# Patient Record
Sex: Male | Born: 2000 | Race: White | Hispanic: No | Marital: Single | Smoking: Current every day smoker
Health system: Southern US, Community
[De-identification: ages and names within clinical notes are randomized; demographics above are authoritative.]

---
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
## 2018-01-18 ENCOUNTER — Emergency Department (HOSPITAL_COMMUNITY)
Admission: EM | Admit: 2018-01-18 | Discharge: 2018-01-18 | Disposition: A | Discharge status: Home / Self Care | Payer: Self-pay | Attending: Emergency Medicine | Admitting: Emergency Medicine

## 2018-01-18 ENCOUNTER — Other Ambulatory Visit: Payer: Self-pay

## 2018-01-18 ENCOUNTER — Encounter (HOSPITAL_COMMUNITY): Payer: Self-pay | Admitting: Emergency Medicine

## 2018-01-18 DIAGNOSIS — F17210 Nicotine dependence, cigarettes, uncomplicated: Secondary | ICD-10-CM | POA: Insufficient documentation

## 2018-01-18 DIAGNOSIS — R1084 Generalized abdominal pain: Secondary | ICD-10-CM | POA: Insufficient documentation

## 2018-01-18 LAB — CBC
HCT: 47.2 % (ref 39.0–52.0)
Hemoglobin: 15.6 g/dL (ref 13.0–17.0)
MCH: 30.2 pg (ref 26.0–34.0)
MCHC: 33.1 g/dL (ref 30.0–36.0)
MCV: 91.3 fL (ref 80.0–100.0)
Platelets: 279 10*3/uL (ref 150–400)
RBC: 5.17 MIL/uL (ref 4.22–5.81)
RDW: 12 % (ref 11.5–15.5)
WBC: 7.8 10*3/uL (ref 4.0–10.5)
nRBC: 0 % (ref 0.0–0.2)

## 2018-01-18 LAB — COMPREHENSIVE METABOLIC PANEL
ALT: 28 U/L (ref 0–44)
AST: 23 U/L (ref 15–41)
Albumin: 4.7 g/dL (ref 3.5–5.0)
Alkaline Phosphatase: 51 U/L (ref 38–126)
Anion gap: 10 (ref 5–15)
BUN: 11 mg/dL (ref 6–20)
CO2: 25 mmol/L (ref 22–32)
Calcium: 9.3 mg/dL (ref 8.9–10.3)
Chloride: 104 mmol/L (ref 98–111)
Creatinine, Ser: 1.1 mg/dL (ref 0.61–1.24)
GFR calc Af Amer: >60 mL/min (ref >60)
GFR calc non Af Amer: >60 mL/min (ref >60)
Glucose, Bld: 105 mg/dL — ABNORMAL HIGH (ref 70–99)
Potassium: 4.4 mmol/L (ref 3.5–5.1)
Sodium: 139 mmol/L (ref 135–145)
Total Bilirubin: 0.5 mg/dL (ref 0.3–1.2)
Total Protein: 8 g/dL (ref 6.5–8.1)

## 2018-01-18 LAB — LIPASE, BLOOD: Lipase: 24 U/L (ref 11–51)

## 2018-01-18 MED ORDER — DICYCLOMINE HCL 20 MG PO TABS: 20.0000 mg | ORAL_TABLET | Freq: Two times a day (BID) | ORAL | 0 refills | Status: AC

## 2018-01-18 NOTE — Discharge Instructions (Signed)
As discussed, your evaluation today has been largely reassuring.  But, it is important that you monitor your condition carefully, and do not hesitate to return to the ED if you develop new, or concerning changes in your condition. ? ?Otherwise, please follow-up with your physician for appropriate ongoing care. ? ?

## 2018-01-18 NOTE — ED Provider Notes (Signed)
[facility]-EMERGENCY DEPT Provider Note   CSN: [id number] Arrival date & time: 01/18/18  1530     History   Chief Complaint Chief Complaint  Patient presents with  . Abdominal Pain    HPI Casey Cross is a 17 y.o. male.  HPI Previously well young male presents with abdominal pain, which is essentially resolved. Patient was in his usual state of health until this morning, when after awakening he felt some pain around his mid abdomen. Pain is nonradiating, sore, moderate There is at least one episode of loose stool subsequently, but no vomiting. Patient notes that he is not anorexic. No fever, no chest pain, dyspnea or other complaints per Patient states that he is generally well, has no medical problems, has not had no surgery in his abdomen Currently the abdominal pain is minimal, in the same location.  History reviewed. No pertinent past medical history.  There are no active problems to display for this patient.   History reviewed. No pertinent surgical history.      Home Medications    Prior to Admission medications   Medication Sig Start Date End Date Taking? Authorizing Provider  dicyclomine (BENTYL) 20 MG tablet Take 1 tablet (20 mg total) by mouth 2 (two) times daily for 5 days. 01/18/18 01/23/18  Gerhard MunchLockwood, Fonnie Crookshanks, MD    Family History No family history on file.  Social History Social History   Tobacco Use  . Smoking status: Current Every Day Smoker    Types: Cigarettes  . Smokeless tobacco: Never Used  Substance Use Topics  . Alcohol use: Not on file  . Drug use: Not on file     Allergies   Patient has no known allergies.   Review of Systems Review of Systems  Constitutional:       Per HPI, otherwise negative  HENT:       Per HPI, otherwise negative  Respiratory:       Per HPI, otherwise negative  Cardiovascular:       Per HPI, otherwise negative  Gastrointestinal: Positive for abdominal pain and diarrhea.  Negative for vomiting.  Endocrine:       Negative aside from HPI  Genitourinary:       Neg aside from HPI   Musculoskeletal:       Per HPI, otherwise negative  Skin: Negative.   Neurological: Negative for syncope.     Physical Exam Updated Vital Signs Ht 5' 6.5" (1.689 m)   Physical Exam  Constitutional: He is oriented to person, place, and time. He appears well-developed. No distress.  HENT:  Head: Normocephalic and atraumatic.  Eyes: Conjunctivae and EOM are normal.  Cardiovascular: Normal rate and regular rhythm.  Pulmonary/Chest: Effort normal. No stridor. No respiratory distress.  Abdominal: He exhibits no distension.  No guarding, no rebound, no appreciable pain anywhere, though the patient notes that the midabdomen is work earlier today.  Musculoskeletal: He exhibits no edema.  Neurological: He is alert and oriented to person, place, and time.  Skin: Skin is warm and dry.  Psychiatric: He has a normal mood and affect.  Nursing note and vitals reviewed.    ED Treatments / Results  Labs (all labs ordered are listed, but only abnormal results are displayed) Labs Reviewed  COMPREHENSIVE METABOLIC PANEL - Abnormal; Notable for the following components:      Result Value   Glucose, Bld 105 (*)    All other components within normal limits  LIPASE, BLOOD  CBC  URINALYSIS, ROUTINE W REFLEX MICROSCOPIC   Procedures Procedures (including critical care time)  Medications Ordered in ED Medications - No data to display   Initial Impression / Assessment and Plan / ED Course  I have reviewed the triage vital signs and the nursing notes.  Pertinent labs & imaging results that were available during my care of the patient were reviewed by me and considered in my medical decision making (see chart for details).  Young male presents after an episode of abdominal pain which is essentially resolved. Patient is awake, alert, afebrile, no leukocytosis Patient has no  tenderness on exam, and there is low suspicion for occult appendicitis given his improvement in pain, no nausea, no anorexia. Patient amenable to course of outpatient therapy with pain control, understands return precautions. Absent ongoing pain, hemodynamic instability, abnormal labs found minimal elevated glucose, the patient is appropriate for discharge with close outpatient follow-up.   Final Clinical Impressions(s) / ED Diagnoses   Final diagnoses:  Generalized abdominal pain    ED Discharge Orders         Ordered    dicyclomine (BENTYL) 20 MG tablet  2 times daily     01/18/18 1856           Gerhard Munch, MD 01/18/18 1900

## 2018-01-18 NOTE — ED Triage Notes (Signed)
Per [facility] pt from home for lower abd pains that started when woke up around 6-7 am. Did have some diarrhea last night. Had 3 episodes of vomiting today. 134 palpated. CBG 97

## 2018-01-19 ENCOUNTER — Emergency Department (HOSPITAL_COMMUNITY): Payer: Self-pay | Admitting: Certified Registered Nurse Anesthetist

## 2018-01-19 ENCOUNTER — Other Ambulatory Visit: Payer: Self-pay

## 2018-01-19 ENCOUNTER — Observation Stay (HOSPITAL_COMMUNITY)
Admission: EM | Admit: 2018-01-19 | Discharge: 2018-01-20 | Disposition: A | Discharge status: Home / Self Care | Payer: Self-pay | Attending: Surgery | Admitting: Surgery

## 2018-01-19 ENCOUNTER — Emergency Department (HOSPITAL_COMMUNITY): Payer: Self-pay

## 2018-01-19 ENCOUNTER — Encounter (HOSPITAL_COMMUNITY): Payer: Self-pay | Admitting: Emergency Medicine

## 2018-01-19 ENCOUNTER — Encounter (HOSPITAL_COMMUNITY)
Admission: EM | Disposition: A | Discharge status: Home / Self Care | Payer: Self-pay | Source: Home / Self Care | Attending: Emergency Medicine

## 2018-01-19 DIAGNOSIS — K3589 Other acute appendicitis without perforation or gangrene: Principal | ICD-10-CM | POA: Insufficient documentation

## 2018-01-19 DIAGNOSIS — F17200 Nicotine dependence, unspecified, uncomplicated: Secondary | ICD-10-CM | POA: Insufficient documentation

## 2018-01-19 DIAGNOSIS — R590 Localized enlarged lymph nodes: Secondary | ICD-10-CM | POA: Insufficient documentation

## 2018-01-19 DIAGNOSIS — Z79899 Other long term (current) drug therapy: Secondary | ICD-10-CM | POA: Insufficient documentation

## 2018-01-19 DIAGNOSIS — K3580 Unspecified acute appendicitis: Secondary | ICD-10-CM | POA: Diagnosis present

## 2018-01-19 HISTORY — PX: LAPAROSCOPIC APPENDECTOMY: SHX408

## 2018-01-19 LAB — CBC WITH DIFFERENTIAL/PLATELET
Abs Immature Granulocytes: 0.03 10*3/uL (ref 0.00–0.07)
Basophils Absolute: 0 10*3/uL (ref 0.0–0.1)
Basophils Relative: 0 %
Eosinophils Absolute: 0.1 10*3/uL (ref 0.0–0.5)
Eosinophils Relative: 1 %
HEMATOCRIT: 45.4 % (ref 39.0–52.0)
Hemoglobin: 15.3 g/dL (ref 13.0–17.0)
Immature Granulocytes: 0 %
Lymphocytes Relative: 19 %
Lymphs Abs: 1.7 10*3/uL (ref 0.7–4.0)
MCH: 31.2 pg (ref 26.0–34.0)
MCHC: 33.7 g/dL (ref 30.0–36.0)
MCV: 92.5 fL (ref 80.0–100.0)
Monocytes Absolute: 0.7 10*3/uL (ref 0.1–1.0)
Monocytes Relative: 8 %
NEUTROS PCT: 72 %
Neutro Abs: 6.6 10*3/uL (ref 1.7–7.7)
Platelets: 247 10*3/uL (ref 150–400)
RBC: 4.91 MIL/uL (ref 4.22–5.81)
RDW: 12.1 % (ref 11.5–15.5)
WBC: 9.2 10*3/uL (ref 4.0–10.5)
nRBC: 0 % (ref 0.0–0.2)

## 2018-01-19 LAB — COMPREHENSIVE METABOLIC PANEL
ALT: 24 U/L (ref 0–44)
AST: 19 U/L (ref 15–41)
Albumin: 4.6 g/dL (ref 3.5–5.0)
Alkaline Phosphatase: 47 U/L (ref 38–126)
Anion gap: 9 (ref 5–15)
BILIRUBIN TOTAL: 0.5 mg/dL (ref 0.3–1.2)
BUN: 11 mg/dL (ref 6–20)
CO2: 25 mmol/L (ref 22–32)
Calcium: 9.4 mg/dL (ref 8.9–10.3)
Chloride: 105 mmol/L (ref 98–111)
Creatinine, Ser: 1.01 mg/dL (ref 0.61–1.24)
GFR calc Af Amer: >60 mL/min (ref >60)
Glucose, Bld: 83 mg/dL (ref 70–99)
Potassium: 3.7 mmol/L (ref 3.5–5.1)
Sodium: 139 mmol/L (ref 135–145)
TOTAL PROTEIN: 8 g/dL (ref 6.5–8.1)

## 2018-01-19 LAB — LIPASE, BLOOD: Lipase: 25 U/L (ref 11–51)

## 2018-01-19 SURGERY — APPENDECTOMY, LAPAROSCOPIC
Anesthesia: General

## 2018-01-19 MED ORDER — LACTATED RINGERS IR SOLN
Status: DC | PRN
  Administered 2018-01-19: 1

## 2018-01-19 MED ORDER — PROPOFOL 10 MG/ML IV BOLUS
INTRAVENOUS | Status: AC
  Filled 2018-01-19: qty 40

## 2018-01-19 MED ORDER — LIDOCAINE 2% (20 MG/ML) 5 ML SYRINGE
INTRAMUSCULAR | Status: DC | PRN
  Administered 2018-01-19: 80 mg via INTRAVENOUS

## 2018-01-19 MED ORDER — SODIUM CHLORIDE (PF) 0.9 % IJ SOLN
INTRAMUSCULAR | Status: AC
  Filled 2018-01-19: qty 50

## 2018-01-19 MED ORDER — SODIUM CHLORIDE 0.9 % IV SOLN
2.0000 g | Freq: Once | INTRAVENOUS | Status: AC
  Administered 2018-01-19: 2 g via INTRAVENOUS
  Filled 2018-01-19: qty 20

## 2018-01-19 MED ORDER — FENTANYL CITRATE (PF) 100 MCG/2ML IJ SOLN
INTRAMUSCULAR | Status: DC | PRN
  Administered 2018-01-19: 100 ug via INTRAVENOUS
  Administered 2018-01-19: 50 ug via INTRAVENOUS

## 2018-01-19 MED ORDER — PROPOFOL 10 MG/ML IV BOLUS
INTRAVENOUS | Status: DC | PRN
  Administered 2018-01-19: 200 mg via INTRAVENOUS

## 2018-01-19 MED ORDER — ROCURONIUM BROMIDE 10 MG/ML (PF) SYRINGE
PREFILLED_SYRINGE | INTRAVENOUS | Status: DC | PRN
  Administered 2018-01-19: 40 mg via INTRAVENOUS

## 2018-01-19 MED ORDER — FENTANYL CITRATE (PF) 100 MCG/2ML IJ SOLN
INTRAMUSCULAR | Status: AC
  Filled 2018-01-19: qty 2

## 2018-01-19 MED ORDER — MIDAZOLAM HCL 2 MG/2ML IJ SOLN
INTRAMUSCULAR | Status: AC
  Filled 2018-01-19: qty 2

## 2018-01-19 MED ORDER — ROCURONIUM BROMIDE 100 MG/10ML IV SOLN
INTRAVENOUS | Status: AC
  Filled 2018-01-19: qty 1

## 2018-01-19 MED ORDER — SUCCINYLCHOLINE CHLORIDE 200 MG/10ML IV SOSY
PREFILLED_SYRINGE | INTRAVENOUS | Status: AC
  Filled 2018-01-19: qty 10

## 2018-01-19 MED ORDER — HYDROMORPHONE HCL 1 MG/ML IJ SOLN
INTRAMUSCULAR | Status: DC | PRN
  Administered 2018-01-19 – 2018-01-20 (×2): 1 mg via INTRAVENOUS

## 2018-01-19 MED ORDER — IOPAMIDOL (ISOVUE-300) INJECTION 61%
INTRAVENOUS | Status: AC
  Filled 2018-01-19: qty 100

## 2018-01-19 MED ORDER — LIDOCAINE 2% (20 MG/ML) 5 ML SYRINGE
INTRAMUSCULAR | Status: AC
  Filled 2018-01-19: qty 5

## 2018-01-19 MED ORDER — ONDANSETRON HCL 4 MG/2ML IJ SOLN
INTRAMUSCULAR | Status: AC
  Filled 2018-01-19: qty 2

## 2018-01-19 MED ORDER — LACTATED RINGERS IV SOLN
INTRAVENOUS | Status: DC | PRN
  Administered 2018-01-19 (×2): via INTRAVENOUS

## 2018-01-19 MED ORDER — SUCCINYLCHOLINE CHLORIDE 200 MG/10ML IV SOSY
PREFILLED_SYRINGE | INTRAVENOUS | Status: DC | PRN
  Administered 2018-01-19: 60 mg via INTRAVENOUS

## 2018-01-19 MED ORDER — METRONIDAZOLE IN NACL 5-0.79 MG/ML-% IV SOLN
INTRAVENOUS | Status: AC
  Filled 2018-01-19: qty 100

## 2018-01-19 MED ORDER — BUPIVACAINE-EPINEPHRINE (PF) 0.25% -1:200000 IJ SOLN
INTRAMUSCULAR | Status: AC
  Filled 2018-01-19: qty 30

## 2018-01-19 MED ORDER — 0.9 % SODIUM CHLORIDE (POUR BTL) OPTIME
TOPICAL | Status: DC | PRN
  Administered 2018-01-19: 1000 mL

## 2018-01-19 MED ORDER — HYDROMORPHONE HCL 2 MG/ML IJ SOLN
INTRAMUSCULAR | Status: AC
  Filled 2018-01-19: qty 1

## 2018-01-19 MED ORDER — DEXAMETHASONE SODIUM PHOSPHATE 10 MG/ML IJ SOLN
INTRAMUSCULAR | Status: AC
  Filled 2018-01-19: qty 1

## 2018-01-19 MED ORDER — MORPHINE SULFATE (PF) 4 MG/ML IV SOLN
4.0000 mg | Freq: Once | INTRAVENOUS | Status: AC
  Administered 2018-01-19: 4 mg via INTRAVENOUS
  Filled 2018-01-19: qty 1

## 2018-01-19 MED ORDER — ONDANSETRON HCL 4 MG/2ML IJ SOLN
INTRAMUSCULAR | Status: DC | PRN
  Administered 2018-01-19: 4 mg via INTRAVENOUS

## 2018-01-19 MED ORDER — ONDANSETRON HCL 4 MG/2ML IJ SOLN
4.0000 mg | Freq: Once | INTRAMUSCULAR | Status: AC
  Administered 2018-01-19: 4 mg via INTRAVENOUS
  Filled 2018-01-19: qty 2

## 2018-01-19 MED ORDER — SUGAMMADEX SODIUM 200 MG/2ML IV SOLN
INTRAVENOUS | Status: AC
  Filled 2018-01-19: qty 2

## 2018-01-19 MED ORDER — IOPAMIDOL (ISOVUE-300) INJECTION 61%
100.0000 mL | Freq: Once | INTRAVENOUS | Status: AC | PRN
  Administered 2018-01-19: 100 mL via INTRAVENOUS

## 2018-01-19 MED ORDER — DEXAMETHASONE SODIUM PHOSPHATE 4 MG/ML IJ SOLN
INTRAMUSCULAR | Status: DC | PRN
  Administered 2018-01-19: 10 mg via INTRAVENOUS

## 2018-01-19 MED ORDER — MIDAZOLAM HCL 5 MG/5ML IJ SOLN
INTRAMUSCULAR | Status: DC | PRN
  Administered 2018-01-19: 2 mg via INTRAVENOUS

## 2018-01-19 MED ORDER — SODIUM CHLORIDE 0.9 % IV SOLN
INTRAVENOUS | Status: AC
  Filled 2018-01-19: qty 20

## 2018-01-19 MED ORDER — METRONIDAZOLE IN NACL 5-0.79 MG/ML-% IV SOLN
500.0000 mg | Freq: Once | INTRAVENOUS | Status: AC
  Administered 2018-01-19: 500 mg via INTRAVENOUS
  Filled 2018-01-19: qty 100

## 2018-01-19 SURGICAL SUPPLY — 38 items
APPLIER CLIP ROT 10 11.4 M/L (STAPLE)
BENZOIN TINCTURE PRP APPL 2/3 (GAUZE/BANDAGES/DRESSINGS) IMPLANT
CABLE HIGH FREQUENCY MONO STRZ (ELECTRODE) IMPLANT
CHLORAPREP W/TINT 26ML (MISCELLANEOUS) ×3 IMPLANT
CLIP APPLIE ROT 10 11.4 M/L (STAPLE) IMPLANT
CLOSURE WOUND 1/2 X4 (GAUZE/BANDAGES/DRESSINGS)
COVER SURGICAL LIGHT HANDLE (MISCELLANEOUS) ×3 IMPLANT
COVER WAND RF STERILE (DRAPES) IMPLANT
CUTTER FLEX LINEAR 45M (STAPLE) ×3 IMPLANT
DECANTER SPIKE VIAL GLASS SM (MISCELLANEOUS) IMPLANT
DERMABOND ADVANCED (GAUZE/BANDAGES/DRESSINGS) ×2
DERMABOND ADVANCED .7 DNX12 (GAUZE/BANDAGES/DRESSINGS) ×1 IMPLANT
DRAPE LAPAROSCOPIC ABDOMINAL (DRAPES) IMPLANT
ELECT PENCIL ROCKER SW 15FT (MISCELLANEOUS) ×3 IMPLANT
ELECT REM PT RETURN 15FT ADLT (MISCELLANEOUS) ×3 IMPLANT
ENDOLOOP SUT PDS II  0 18 (SUTURE)
ENDOLOOP SUT PDS II 0 18 (SUTURE) IMPLANT
GLOVE SURG SIGNA 7.5 PF LTX (GLOVE) ×9 IMPLANT
GOWN STRL REUS W/TWL XL LVL3 (GOWN DISPOSABLE) ×9 IMPLANT
KIT BASIN OR (CUSTOM PROCEDURE TRAY) ×3 IMPLANT
POUCH RETRIEVAL ECOSAC 10 (ENDOMECHANICALS) ×1 IMPLANT
POUCH RETRIEVAL ECOSAC 10MM (ENDOMECHANICALS) ×2
POUCH SPECIMEN RETRIEVAL 10MM (ENDOMECHANICALS) IMPLANT
RELOAD 45 VASCULAR/THIN (ENDOMECHANICALS) IMPLANT
RELOAD STAPLE TA45 3.5 REG BLU (ENDOMECHANICALS) ×3 IMPLANT
SCISSORS LAP 5X35 DISP (ENDOMECHANICALS) ×3 IMPLANT
SET IRRIG TUBING LAPAROSCOPIC (IRRIGATION / IRRIGATOR) ×3 IMPLANT
SHEARS HARMONIC ACE PLUS 36CM (ENDOMECHANICALS) ×3 IMPLANT
SLEEVE XCEL OPT CAN 5 100 (ENDOMECHANICALS) ×3 IMPLANT
STRIP CLOSURE SKIN 1/2X4 (GAUZE/BANDAGES/DRESSINGS) IMPLANT
SUT MNCRL AB 4-0 PS2 18 (SUTURE) ×3 IMPLANT
SUT VIC AB 2-0 SH 18 (SUTURE) IMPLANT
TOWEL OR 17X26 10 PK STRL BLUE (TOWEL DISPOSABLE) ×3 IMPLANT
TOWEL OR NON WOVEN STRL DISP B (DISPOSABLE) IMPLANT
TRAY LAPAROSCOPIC (CUSTOM PROCEDURE TRAY) ×3 IMPLANT
TROCAR BLADELESS OPT 5 100 (ENDOMECHANICALS) ×3 IMPLANT
TROCAR XCEL BLUNT TIP 100MML (ENDOMECHANICALS) ×3 IMPLANT
WIRE ELECTR L HOOK LAP OPTI2 (MISCELLANEOUS) IMPLANT

## 2018-01-19 NOTE — ED Provider Notes (Signed)
[facility]-EMERGENCY DEPT Provider Note   CSN: [id number] Arrival date & time: 01/19/18  1919     History   Chief Complaint No chief complaint on file.   HPI Casey Cross is a 17 y.o. male.  Patient presents the emergency department with a chief complaint of right lower abdominal pain.  States that he was seen yesterday for similar symptoms.  He had had some nausea and vomiting and diarrhea earlier in the week, but states this has pretty much resolved.  States that after his evaluation yesterday, he felt much better, and was able to go home, rest, and eat.  States that the pain worsened today.  He reports that it is localizing to the right lower abdomen.  Denies any reported fevers at home.  Symptoms are worsened with movement and palpation.  The history is provided by the patient. No language interpreter was used.    History reviewed. No pertinent past medical history.  There are no active problems to display for this patient.   History reviewed. No pertinent surgical history.      Home Medications    Prior to Admission medications   Medication Sig Start Date End Date Taking? Authorizing Provider  dicyclomine (BENTYL) 20 MG tablet Take 1 tablet (20 mg total) by mouth 2 (two) times daily for 5 days. 01/18/18 01/23/18  Gerhard Munch, MD    Family History No family history on file.  Social History Social History   Tobacco Use  . Smoking status: Current Every Day Smoker    Packs/day: 0.50    Types: Cigarettes  . Smokeless tobacco: Never Used  Substance Use Topics  . Alcohol use: Never    Frequency: Never  . Drug use: Never     Allergies   Patient has no known allergies.   Review of Systems Review of Systems  All other systems reviewed and are negative.    Physical Exam Updated Vital Signs BP (!) 140/92   Pulse (!) 102   Temp (!) 97.5 F (36.4 C) (Oral)   Resp 18   SpO2 98%   Physical Exam  Constitutional: He is  oriented to person, place, and time. He appears well-developed and well-nourished.  HENT:  Head: Normocephalic and atraumatic.  Eyes: Pupils are equal, round, and reactive to light. Conjunctivae and EOM are normal. Right eye exhibits no discharge. Left eye exhibits no discharge. No scleral icterus.  Neck: Normal range of motion. Neck supple. No JVD present.  Cardiovascular: Normal rate, regular rhythm and normal heart sounds. Exam reveals no gallop and no friction rub.  No murmur heard. Pulmonary/Chest: Effort normal and breath sounds normal. No respiratory distress. He has no wheezes. He has no rales. He exhibits no tenderness.  Abdominal: Soft. He exhibits no distension and no mass. There is tenderness. There is no rebound and no guarding.  Tenderness palpation right lower quadrant  Musculoskeletal: Normal range of motion. He exhibits no edema or tenderness.  Neurological: He is alert and oriented to person, place, and time.  Skin: Skin is warm and dry.  Psychiatric: He has a normal mood and affect. His behavior is normal. Judgment and thought content normal.  Nursing note and vitals reviewed.    ED Treatments / Results  Labs (all labs ordered are listed, but only abnormal results are displayed) Labs Reviewed  CBC WITH DIFFERENTIAL/PLATELET  COMPREHENSIVE METABOLIC PANEL  LIPASE, BLOOD    EKG None  Radiology Ct Abdomen Pelvis W Contrast  Result Date: 01/19/2018  CLINICAL DATA:  Right lower quadrant abdominal pain since yesterday. The pain was more generalized yesterday. EXAM: CT ABDOMEN AND PELVIS WITH CONTRAST TECHNIQUE: Multidetector CT imaging of the abdomen and pelvis was performed using the standard protocol following bolus administration of intravenous contrast. CONTRAST:  100mL ISOVUE-300 IOPAMIDOL (ISOVUE-300) INJECTION 61% COMPARISON:  None. FINDINGS: Lower chest: Clear lung bases. Hepatobiliary: No focal liver abnormality is seen. No gallstones, gallbladder wall  thickening, or biliary dilatation. Pancreas: Unremarkable. No pancreatic ductal dilatation or surrounding inflammatory changes. Spleen: Normal in size without focal abnormality. Adrenals/Urinary Tract: Adrenal glands are unremarkable. Kidneys are normal, without renal calculi, focal lesion, or hydronephrosis. Bladder is unremarkable. Stomach/Bowel: Dilated appendix with diffuse wall thickening and enhancement. The appendiceal margins are ill-defined with periappendiceal soft tissue stranding. No fluid collections are seen. The origin of the appendix is in the upper right pelvis and the tip is also in the upper right pelvis. The appendix measures 10 mm in maximum diameter. Unremarkable stomach, small bowel and colon. Vascular/Lymphatic: Multiple mildly enlarged right lower quadrant mesenteric lymph nodes. The largest has a short axis diameter of 8 mm on image number 53 series 2. No vascular abnormalities. Reproductive: Prostate is unremarkable. Other: No free peritoneal air. Musculoskeletal: Mild thoracolumbar scoliosis. Mild lower thoracic spine Schmorl's node formation. IMPRESSION: 1. Acute appendicitis without abscess. 2. Mild reactive right lower quadrant mesenteric adenopathy. These results were called by telephone at the time of interpretation on 01/19/2018 at 9:47 pm to Dr. Jacqulyn BathLong, who verbally acknowledged these results. Electronically Signed   By: Beckie SaltsSteven  Reid M.D.   On: 01/19/2018 21:50    Procedures Procedures (including critical care time)  Medications Ordered in ED Medications  morphine 4 MG/ML injection 4 mg (has no administration in time range)  ondansetron (ZOFRAN) injection 4 mg (has no administration in time range)     Initial Impression / Assessment and Plan / ED Course  I have reviewed the triage vital signs and the nursing notes.  Pertinent labs & imaging results that were available during my care of the patient were reviewed by me and considered in my medical decision making (see  chart for details).     Patient with right lower abdominal pain.  Seen yesterday, and advised to follow-up if symptoms worsen.  Patient states that he felt well last night, but the pain returned and worsened today.  Vital signs are stable.  He does have tenderness on exam.  Will check CT today.  We will also check labs and treat pain.  Will reassess.  CT consistent with acute appendicitis.  I discussed the case with Dr. Ezzard StandingNewman from general surgery who will see the patient.  Final Clinical Impressions(s) / ED Diagnoses   Final diagnoses:  Other acute appendicitis    ED Discharge Orders    None       Roxy HorsemanBrowning, Damilola Flamm, PA-C 01/19/18 2202    Maia PlanLong, Joshua G, MD 01/20/18 1558

## 2018-01-19 NOTE — ED Notes (Signed)
Patient ambulated to CT

## 2018-01-19 NOTE — ED Triage Notes (Signed)
Pt from home with c/o RLQ pain that he was seen for yesterday. Pt states he was seen for same yestrday but pain was more generalized then. Pt denies emesis today. Pt's lbm was today and normal

## 2018-01-19 NOTE — ED Notes (Addendum)
MD at bedside. Obtained consent for surgery and consent form will be sent to OR with patient. Rocephin and Flagyl is also being sent to OR with patient as this RN did not administer.

## 2018-01-19 NOTE — Anesthesia Preprocedure Evaluation (Signed)
Anesthesia Evaluation  Patient identified by MRN, date of birth, ID band Patient awake    Reviewed: Allergy & Precautions, NPO status , Patient's Chart, lab work & pertinent test results  Airway Mallampati: II  TM Distance: >3 FB Neck ROM: Full    Dental no notable dental hx.    Pulmonary Current Smoker,    Pulmonary exam normal breath sounds clear to auscultation       Cardiovascular negative cardio ROS Normal cardiovascular exam Rhythm:Regular Rate:Normal     Neuro/Psych negative neurological ROS  negative psych ROS   GI/Hepatic negative GI ROS, Neg liver ROS,   Endo/Other  negative endocrine ROS  Renal/GU negative Renal ROS     Musculoskeletal negative musculoskeletal ROS (+)   Abdominal   Peds  Hematology negative hematology ROS (+)   Anesthesia Other Findings acute appendicitis  Reproductive/Obstetrics                             Anesthesia Physical Anesthesia Plan  ASA: II  Anesthesia Plan: General   Post-op Pain Management:    Induction: Intravenous  PONV Risk Score and Plan: 2 and Ondansetron, Dexamethasone, Midazolam and Treatment may vary due to age or medical condition  Airway Management Planned: Oral ETT  Additional Equipment:   Intra-op Plan:   Post-operative Plan: Extubation in OR  Informed Consent: I have reviewed the patients History and Physical, chart, labs and discussed the procedure including the risks, benefits and alternatives for the proposed anesthesia with the patient or authorized representative who has indicated his/her understanding and acceptance.   Dental advisory given  Plan Discussed with: CRNA  Anesthesia Plan Comments:         Anesthesia Quick Evaluation

## 2018-01-19 NOTE — Anesthesia Procedure Notes (Signed)
Procedure Name: Intubation Date/Time: 01/19/2018 11:13 PM Performed by: Vanessa Durhamochran, Kylor Valverde Glenn, CRNA Pre-anesthesia Checklist: Patient identified, Emergency Drugs available, Suction available and Patient being monitored Patient Re-evaluated:Patient Re-evaluated prior to induction Oxygen Delivery Method: Circle system utilized Preoxygenation: Pre-oxygenation with 100% oxygen Induction Type: IV induction Ventilation: Mask ventilation without difficulty Laryngoscope Size: 2 and Miller Grade View: Grade I Tube type: Oral Tube size: 7.5 mm Number of attempts: 1 Airway Equipment and Method: Stylet Placement Confirmation: ETT inserted through vocal cords under direct vision,  positive ETCO2 and breath sounds checked- equal and bilateral Tube secured with: Tape Dental Injury: Teeth and Oropharynx as per pre-operative assessment

## 2018-01-19 NOTE — H&P (Signed)
Re:   Casey Cross DOB:   2000-10-23 MRN:   [id number]  Chief Complaint Abdominal pain  ASSESEMENT AND PLAN: 1.  Appendicitis   I discussed with the patient the indications and risks of appendiceal surgery.  The primary risks of appendiceal surgery include, but are not limited to, bleeding, infection, bowel surgery, and open surgery.  There is also the risk that the patient may have continued symptoms after surgery.  We discussed the typical post-operative recovery course. I tried to answer the patient's questions.  2.  Smokes  No chief complaint on file.  PHYSICIAN REQUESTING CONSULTATION: Patient, No Pcp Per  HISTORY OF PRESENT ILLNESS: Casey Cross is a 17 y.o. (DOB: 2000-08-17)  white male whose primary care physician is Patient, No Pcp Per.        He is here with his friend, Donah Driver, grandmother, Debria Garret, and great grandmother, Tori Milks.   He developed abdominal pain yesterday.  Came to the [facility].   The pain got better and he was released.  But then he started having more pain and came back to the ER today.  He vomited yesterday, but has not vomited today.  He has no fever.  He has had no prior abdominal surgery.  He has not history of stomach, liver, or colon disease.  CT scan - 01/19/2018 - 1. Acute appendicitis without abscess.  2. Mild reactive right lower quadrant mesenteric adenopathy. WBC - 9,200 - 01/19/2018   History reviewed. No pertinent past medical history.   History reviewed. No pertinent surgical history.    Current Facility-Administered Medications  Medication Dose Route Frequency Provider Last Rate Last Dose  . cefTRIAXone (ROCEPHIN) 2 g in sodium chloride 0.9 % 100 mL IVPB  2 g Intravenous Once Roxy Horseman, PA-C       And  . metroNIDAZOLE (FLAGYL) IVPB 500 mg  500 mg Intravenous Once Roxy Horseman, PA-C      . iopamidol (ISOVUE-300) 61 % injection           . sodium chloride (PF) 0.9 % injection            Current  Outpatient Medications  Medication Sig Dispense Refill  . dicyclomine (BENTYL) 20 MG tablet Take 1 tablet (20 mg total) by mouth 2 (two) times daily for 5 days. 10 tablet 0     No Known Allergies  REVIEW OF SYSTEMS: Skin:  No history of rash.  No history of abnormal moles. Infection:  No history of hepatitis or HIV.  No history of MRSA. Neurologic:  No history of stroke.  No history of seizure.  No history of headaches. Cardiac:  No history of hypertension. No history of heart disease.  No history of prior cardiac catheterization.  No history of seeing a cardiologist. Pulmonary:  Smokes.  He knows that this is bad for his health.  Endocrine:  No diabetes. No thyroid disease. Gastrointestinal:  See HPI Urologic:  No history of kidney stones.  No history of bladder infections. Musculoskeletal:  No history of joint or back disease. Hematologic:  No bleeding disorder.  No history of anemia.  Not anticoagulated. Psycho-social:  The patient is oriented.   The patient has no obvious psychologic or social impairment to understanding our conversation and plan.  SOCIAL and FAMILY HISTORY: Unmarried. Lives with great grandmother (?) [name] or grandmother, Debria Garret. He is here with his friend, Donah Driver, and grand mother. His mother is in [place], but has lost her  license and cannot get up here.  No family history of appendicits.  PHYSICAL EXAM: BP (!) 140/92   Pulse (!) 102   Temp (!) 97.5 F (36.4 C) (Oral)   Resp 18   SpO2 98%   General: Mildly obese WM who is alert and generally healthy appearing.  Has long hair and short facial hair Skin:  Inspection and palpation - no mass or rash. Eyes:  Conjunctiva and lids unremarkable.            Pupils are equal Ears, Nose, Mouth, and Throat:  Ears and nose unremarkable            Lips and teeth are unremarable. Neck: Supple. No mass, trachea midline.  No thyroid mass. Lymph Nodes:  No supraclavicular, cervical, or inguinal  nodes. Lungs: Normal respiratory effort.  Clear to auscultation and symmetric breath sounds. Heart:  Palpation of the heart is normal.            Auscultation: RRR. No murmur or rub.  Abdomen: Soft. No mass. No tenderness. No hernia.             Normal bowel sounds.  No abdominal scars.  Slightly overweight - mild tenderness in RLQ. Rectal: Not done. Musculoskeletal:  Normal gait.            Good muscle strength and ROM  in upper and lower extremities.  Neurologic:  Grossly intact to motor and sensory function. Psychiatric: Normal judgement and insight. Behavior is normal.            Oriented to time, person, place.   DATA REVIEWED, COUNSELING AND COORDINATION OF CARE: Epic notes reviewed. Counseling and coordination of care exceeded more than 50% of the time spent with patient. Total time spent with patient and charting: 45 minutes.  Ovidio Kinavid Toriana Sponsel, MD,  [facility] [address]  [address]   [place], [place]    [id number] Phone:  [phone] FAX:  [phone]

## 2018-01-20 ENCOUNTER — Other Ambulatory Visit: Payer: Self-pay

## 2018-01-20 ENCOUNTER — Encounter (HOSPITAL_COMMUNITY): Payer: Self-pay | Admitting: Surgery

## 2018-01-20 DIAGNOSIS — K3580 Unspecified acute appendicitis: Secondary | ICD-10-CM | POA: Diagnosis present

## 2018-01-20 MED ORDER — BUPIVACAINE-EPINEPHRINE 0.25% -1:200000 IJ SOLN
INTRAMUSCULAR | Status: DC | PRN
  Administered 2018-01-20: 30 mL

## 2018-01-20 MED ORDER — SUGAMMADEX SODIUM 200 MG/2ML IV SOLN
INTRAVENOUS | Status: DC | PRN
  Administered 2018-01-20: 200 mg via INTRAVENOUS

## 2018-01-20 MED ORDER — OXYCODONE HCL 5 MG/5ML PO SOLN
5.0000 mg | Freq: Once | ORAL | Status: DC | PRN
  Filled 2018-01-20: qty 5

## 2018-01-20 MED ORDER — OXYCODONE HCL 5 MG PO TABS: 5.0000 mg | ORAL_TABLET | ORAL | 0 refills | Status: AC | PRN

## 2018-01-20 MED ORDER — ONDANSETRON 4 MG PO TBDP: 4.0000 mg | ORAL_TABLET | Freq: Four times a day (QID) | ORAL | Status: DC | PRN

## 2018-01-20 MED ORDER — SODIUM CHLORIDE 0.9 % IV SOLN
2.0000 g | INTRAVENOUS | Status: DC
  Administered 2018-01-20: 2 g via INTRAVENOUS
  Filled 2018-01-20: qty 2

## 2018-01-20 MED ORDER — TRAMADOL HCL 50 MG PO TABS: 50.0000 mg | ORAL_TABLET | Freq: Four times a day (QID) | ORAL | Status: DC | PRN

## 2018-01-20 MED ORDER — ACETAMINOPHEN 325 MG PO TABS: 650.0000 mg | ORAL_TABLET | ORAL | Status: AC | PRN

## 2018-01-20 MED ORDER — PROMETHAZINE HCL 25 MG/ML IJ SOLN: 6.2500 mg | INTRAMUSCULAR | Status: DC | PRN

## 2018-01-20 MED ORDER — HYDROCODONE-ACETAMINOPHEN 5-325 MG PO TABS
1.0000 | ORAL_TABLET | ORAL | Status: DC | PRN
  Administered 2018-01-20 (×2): 1 via ORAL
  Filled 2018-01-20 (×2): qty 1

## 2018-01-20 MED ORDER — KCL IN DEXTROSE-NACL 20-5-0.45 MEQ/L-%-% IV SOLN
INTRAVENOUS | Status: DC
  Administered 2018-01-20: 01:00:00 via INTRAVENOUS
  Filled 2018-01-20: qty 1000

## 2018-01-20 MED ORDER — ONDANSETRON HCL 4 MG/2ML IJ SOLN: 4.0000 mg | Freq: Four times a day (QID) | INTRAMUSCULAR | Status: DC | PRN

## 2018-01-20 MED ORDER — ACETAMINOPHEN 325 MG PO TABS
650.0000 mg | ORAL_TABLET | Freq: Three times a day (TID) | ORAL | Status: DC
  Administered 2018-01-20 (×2): 650 mg via ORAL
  Filled 2018-01-20 (×2): qty 2

## 2018-01-20 MED ORDER — HYDROMORPHONE HCL 1 MG/ML IJ SOLN: 0.2500 mg | INTRAMUSCULAR | Status: DC | PRN

## 2018-01-20 MED ORDER — OXYCODONE HCL 5 MG PO TABS: 5.0000 mg | ORAL_TABLET | Freq: Once | ORAL | Status: DC | PRN

## 2018-01-20 MED ORDER — MORPHINE SULFATE (PF) 2 MG/ML IV SOLN: 1.0000 mg | INTRAVENOUS | Status: DC | PRN

## 2018-01-20 MED ORDER — METRONIDAZOLE IN NACL 5-0.79 MG/ML-% IV SOLN
500.0000 mg | Freq: Three times a day (TID) | INTRAVENOUS | Status: DC
  Administered 2018-01-20: 500 mg via INTRAVENOUS
  Filled 2018-01-20: qty 100

## 2018-01-20 NOTE — Progress Notes (Signed)
Discharge instructions discussed with patient, verbalized agreement and understanding 

## 2018-01-20 NOTE — Transfer of Care (Addendum)
Immediate Anesthesia Transfer of Care Note  Patient: Casey Cross F Rinke  Procedure(s) Performed: APPENDECTOMY LAPAROSCOPIC (N/A )  Patient Location: PACU  Anesthesia Type:General  Level of Consciousness: awake, alert , oriented and patient cooperative  Airway & Oxygen Therapy: Patient Spontanous Breathing and Patient connected to face mask  Post-op Assessment: Report given to RN and Post -op Vital signs reviewed and stable  Post vital signs: Reviewed and stable  Last Vitals:  Vitals Value Taken Time  BP 127/76 01/20/2018 12:18 AM  Temp    Pulse 110 01/20/2018 12:19 AM  Resp 16 01/20/2018 12:19 AM  SpO2 93 % 01/20/2018 12:19 AM  Vitals shown include unvalidated device data.  Last Pain:  Vitals:   01/19/18 2122  TempSrc:   PainSc: 1          Complications: No apparent anesthesia complications

## 2018-01-20 NOTE — Op Note (Signed)
Re:   Casey LoCharles F Faughn DOB:   05-27-2000 MRN:   [id number]                   [facility]  DATE OF PROCEDURE: 01/20/2018                              OPERATIVE REPORT  PREOPERATIVE DIAGNOSIS:  Appendicitis  POSTOPERATIVE DIAGNOSIS:  Acute purulent appendicitis.  PROCEDURE:  Laparoscopic appendectomy.  SURGEON:  Sandria Balesavid H. Ezzard StandingNewman, MD  ASSISTANT:  No first assistant.  ANESTHESIA:  General endotracheal.  Anesthesiologist: Leonides GrillsEllender, Ryan P, MD CRNA: Vanessa Durhamochran, Ronald Glenn, CRNA  ASA:  2E  ESTIMATED BLOOD LOSS:  Minimal.  DRAINS: none   SPECIMEN:   Appendix  COUNTS CORRECT:  YES  INDICATIONS FOR PROCEDURE: Casey Cross is a 17 y.o. (DOB: 05-27-2000) white male whose primary care doctor is Patient, No Pcp Per and comes to the OR for an appendectomy.  This is emergency surgery.  I discussed with the patient, the indications and potential complications of appendiceal surgery.  The potential complications include, but are not limited to, bleeding, open surgery, bowel resection, and the possibility of another diagnosis.  OPERATIVE NOTE:  The patient underwent a general endotracheal anesthetic as supervised by Anesthesiologist: Leonides GrillsEllender, Ryan P, MD CRNA: Vanessa Durhamochran, Ronald Glenn, CRNA, General, in [facility].  The patient was given rocephin and flagyl prior to the beginning of the procedure and the abdomen was prepped with ChloraPrep.   A time-out was held and surgical checklist run.  An infraumbilical incision was made with sharp dissection carried down to the abdominal cavity.  An 12 mm Hasson trocar was inserted through the infraumbilical incision and into the peritoneal cavity.  A 30 degree 5 mm laparoscope was inserted through a 12 mm Hasson trocar and the Hasson trocar secured with a 0 Vicryl suture.  I placed a 5 mm trocar in the right upper quadrant and a 5 mm torcar in left lower quadrant and did abdominal exploration.    The right and left lobes of liver unremarkable.   Stomach was unremarkable.  The pelvic organs were unremarkable.  I saw no other intra-abdominal abnormality.  The patient had appendicitis with the appendix located the right pelvic brim. It was curled, acutely inflamed, and covered in pus.  The appendix was not ruptured.  The mesentery of the appendix was divided with a Harmonic scalpel.  I got to the base of the appendix.  I then used a blue load 45 mm Ethicon Endo-GIA stapler and fired this across the base of the appendix.  I placed the appendix in EcoSac bag and delivered the bag through the umbilical incision.  I irrigated the abdomen with 400 cc of saline.  After irrigating the abdomen, I then removed the trocars, in turn.  The umbilical port fascia was closed with 0 Vicryl suture.   I closed the skin each site with a 4-0 Monocryl suture and painted the wounds with DermaBond.  I then injected a total of 30 mL of 0.25% Marcaine at the incisions.  Sponge and needle count were correct at the end of the case.  The patient was transferred to the recovery room in good condition.  The patient tolerated the procedure well and it depends on the patient's post op clinical course as to when the patient could be discharged.   Ovidio Kinavid Laurali Goddard, MD, [facility] Pager:  [phone] Office phone:  [phone]

## 2018-01-20 NOTE — Discharge Summary (Addendum)
     Patient ID: Casey LoCharles F Hyslop [id number] September 13, 2000 17 y.o.  Admit date: 01/19/2018 Discharge date: 01/20/2018  Admitting Diagnosis: Acute appendicitis  Discharge Diagnosis Patient Active Problem List   Diagnosis Date Noted  . Acute appendicitis 01/20/2018    Consultants none  Reason for Admission: Casey Cross is a 17 y.o. (DOB: September 13, 2000)  white male whose primary care physician is Patient, No Pcp Per.        He is here with his friend, Donah DriverHunter Ruth, grandmother, Debria GarretSandra Whitesell, and great grandmother, Tori MilksShirley Manness.              He developed abdominal pain yesterday.  [facility]   The pain got better and he was released.  But then he started having more pain and came back to the ER today.  He vomited yesterday, but has not vomited today.  He has no fever.  He has had no prior abdominal surgery.  He has not history of stomach, liver, or colon disease.  CT scan - 01/19/2018 - 1. Acute appendicitis without abscess.             2. Mild reactive right lower quadrant mesenteric adenopathy.  Procedures Lap appy, Dr. Ezzard StandingNewman 01/20/18  Hospital Course:  The patient was admitted and underwent a laparoscopic appendectomy.  The patient tolerated the procedure well.  On POD 0, the patient was tolerating a regular diet, voiding well, mobilizing, and pain was controlled with oral pain medications.  The patient was stable for DC home at this time with appropriate follow up made.     Physical Exam: Abd: soft, appropriately tender, +BS, ND, incisions c/d/i  Allergies as of 01/20/2018   No Known Allergies     Medication List    TAKE these medications   acetaminophen 325 MG tablet Commonly known as:  TYLENOL Take 2 tablets (650 mg total) by mouth every 4 (four) hours as needed.   dicyclomine 20 MG tablet Commonly known as:  BENTYL Take 1 tablet (20 mg total) by mouth 2 (two) times daily for 5 days.   oxyCODONE 5 MG immediate release tablet Commonly known as:  Oxy  IR/ROXICODONE Take 1 tablet (5 mg total) by mouth every 4 (four) hours as needed.        Follow-up Information    [facility]09/2018.   Specialty:  General Surgery Why:  8:45am, arrive by 8:15am for paperwork and check in Contact information: [address] [place] [address] [phone]           Signed: Barnetta ChapelKelly Neldon Shepard, [facility] 01/20/2018, 12:27 PM Pager: [phone]

## 2018-01-20 NOTE — Anesthesia Postprocedure Evaluation (Signed)
Anesthesia Post Note  Patient: Casey Cross  Procedure(s) Performed: APPENDECTOMY LAPAROSCOPIC (N/A )     Patient location during evaluation: PACU Anesthesia Type: General Level of consciousness: awake and alert Pain management: pain level controlled Vital Signs Assessment: post-procedure vital signs reviewed and stable Respiratory status: spontaneous breathing, nonlabored ventilation, respiratory function stable and patient connected to nasal cannula oxygen Cardiovascular status: blood pressure returned to baseline and stable Postop Assessment: no apparent nausea or vomiting Anesthetic complications: no    Last Vitals:  Vitals:   01/20/18 0327 01/20/18 0530  BP: 126/71 (!) 107/53  Pulse: 97 88  Resp: 19 16  Temp: 37.1 C 36.8 C  SpO2: 98% 98%    Last Pain:  Vitals:   01/20/18 0904  TempSrc:   PainSc: 4                  Ryan P Ellender

## 2018-01-20 NOTE — Discharge Instructions (Signed)
[facility] ° °Please arrive at least 30 min before your appointment to complete your check in paperwork.  If you are unable to arrive 30 min prior to your appointment time we may have to cancel or reschedule you. °LAPAROSCOPIC SURGERY: POST OP INSTRUCTIONS °Always review your discharge instruction sheet given to you by the facility where your surgery was performed. °IF YOU HAVE DISABILITY OR FAMILY LEAVE FORMS, YOU MUST BRING THEM TO THE OFFICE FOR PROCESSING.   °DO NOT GIVE THEM TO YOUR DOCTOR. ° °PAIN CONTROL ° °1. First take acetaminophen (Tylenol) AND/or ibuprofen (Advil) to control your pain after surgery.  Follow directions on package.  Taking acetaminophen (Tylenol) and/or ibuprofen (Advil) regularly after surgery will help to control your pain and lower the amount of prescription pain medication you may need.  You should not take more than 4,000 mg (4 grams) of acetaminophen (Tylenol) in 24 hours.  You should not take ibuprofen (Advil), aleve, motrin, naprosyn or other NSAIDS if you have a history of stomach ulcers or chronic kidney disease.  °2. A prescription for pain medication may be given to you upon discharge.  Take your pain medication as prescribed, if you still have uncontrolled pain after taking acetaminophen (Tylenol) or ibuprofen (Advil). °3. Use ice packs to help control pain. °4. If you need a refill on your pain medication, please contact your pharmacy.  They will contact our office to request authorization. Prescriptions will not be filled after 5pm or on week-ends. ° °HOME MEDICATIONS °5. Take your usually prescribed medications unless otherwise directed. ° °DIET °6. You should follow a light diet the first few days after arrival home.  Be sure to include lots of fluids daily. Avoid fatty, fried foods.  ° °CONSTIPATION °7. It is common to experience some constipation after surgery and if you are taking pain medication.  Increasing fluid intake and taking a stool  softener (such as Colace) will usually help or prevent this problem from occurring.  A mild laxative (Milk of Magnesia or Miralax) should be taken according to package instructions if there are no bowel movements after 48 hours. ° °WOUND/INCISION CARE °8. Most patients will experience some swelling and bruising in the area of the incisions.  Ice packs will help.  Swelling and bruising can take several days to resolve.  °9. Unless discharge instructions indicate otherwise, follow guidelines below  °a. STERI-STRIPS - you may remove your outer bandages 48 hours after surgery, and you may shower at that time.  You have steri-strips (small skin tapes) in place directly over the incision.  These strips should be left on the skin for 7-10 days.   °b. DERMABOND/SKIN GLUE - you may shower in 24 hours.  The glue will flake off over the next 2-3 weeks. °10. Any sutures or staples will be removed at the office during your follow-up visit. ° °ACTIVITIES °11. You may resume regular (light) daily activities beginning the next day--such as daily self-care, walking, climbing stairs--gradually increasing activities as tolerated.  You may have sexual intercourse when it is comfortable.  Refrain from any heavy lifting or straining until approved by your doctor. °a. You may drive when you are no longer taking prescription pain medication, you can comfortably wear a seatbelt, and you can safely maneuver your car and apply brakes. ° °FOLLOW-UP °12. You should see your doctor in the office for a follow-up appointment approximately 2-3 weeks after your surgery.  You should have been given your post-op/follow-up appointment when   your surgery was scheduled.  If you did not receive a post-op/follow-up appointment, make sure that you call for this appointment within a day or two after you arrive home to insure a convenient appointment time. ° ° °WHEN TO CALL YOUR DOCTOR: °1. Fever over 101.0 °2. Inability to urinate °3. Continued bleeding from  incision. °4. Increased pain, redness, or drainage from the incision. °5. Increasing abdominal pain ° °The clinic staff is available to answer your questions during regular business hours.  Please don’t hesitate to call and ask to speak to one of the nurses for clinical concerns.  If you have a medical emergency, go to the nearest emergency room or call 911.  A surgeon from [facility] is always on call at the hospital. °[address], [address], [place], [place]  [address] ? [address] [address], [place], [place]   [address] °[phone] ? [phone] ? FAX [phone] ° °

## 2020-08-09 IMAGING — CT CT ABD-PELV W/ CM
2 of 4 series · 16 of 46 positions shown, 18 images · IV contrast (ISOVUE)
Comparison: None.

CLINICAL DATA: Right lower quadrant abdominal pain since yesterday.
The pain was more generalized yesterday.

EXAM:
CT ABDOMEN AND PELVIS WITH CONTRAST
TECHNIQUE: Multidetector CT imaging of the abdomen and pelvis was performed
using the standard protocol following bolus administration of
intravenous contrast.
CONTRAST:  100mL IDQOQH-AXX IOPAMIDOL (IDQOQH-AXX) INJECTION 61%

[Series 2: axial st · axial · 0.86mm/px · z∈[+97,+517]mm · 13 of 97 slices shown, 15 images]
[im 7/97  soft-tissue]
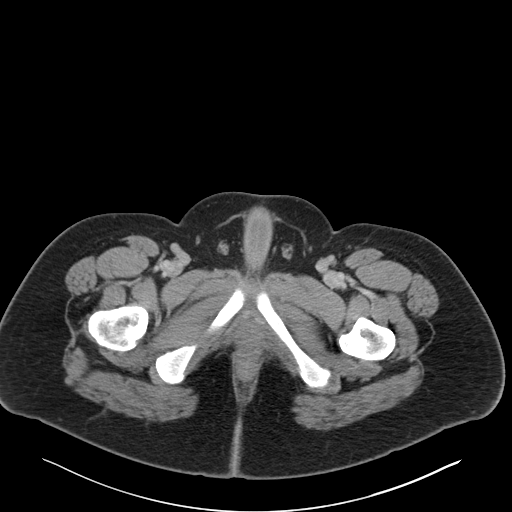
[im 7/97  bone]
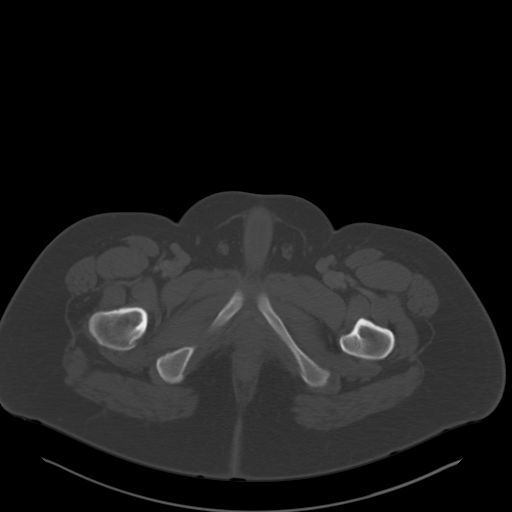
[im 13/97  soft-tissue]
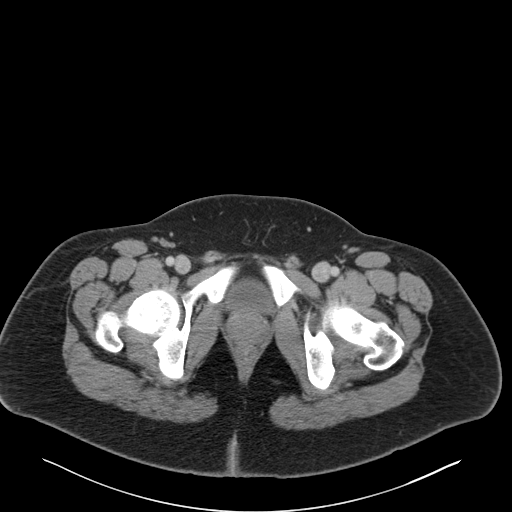
[im 19/97  soft-tissue]
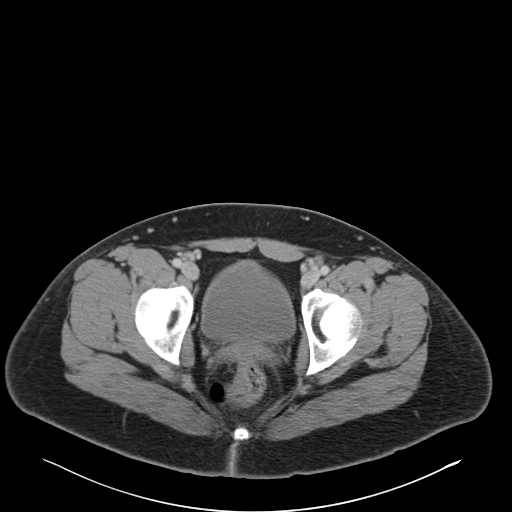
[im 31/97  soft-tissue]
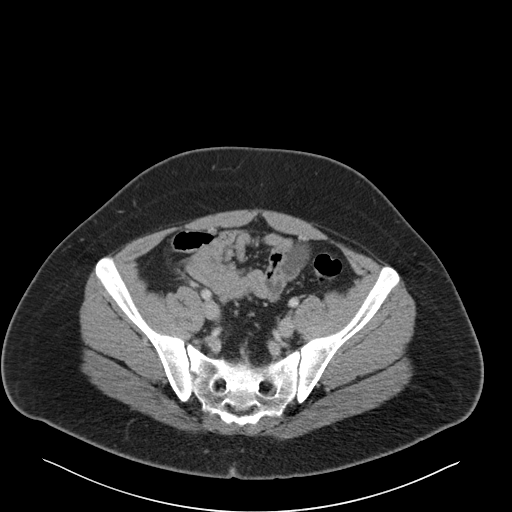
[im 37/97  soft-tissue]
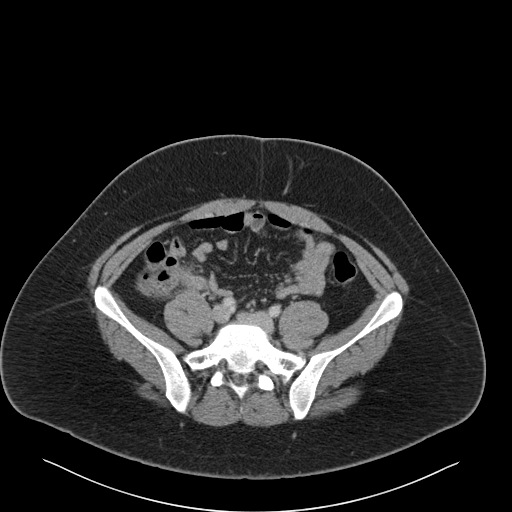
[im 43/97  soft-tissue]
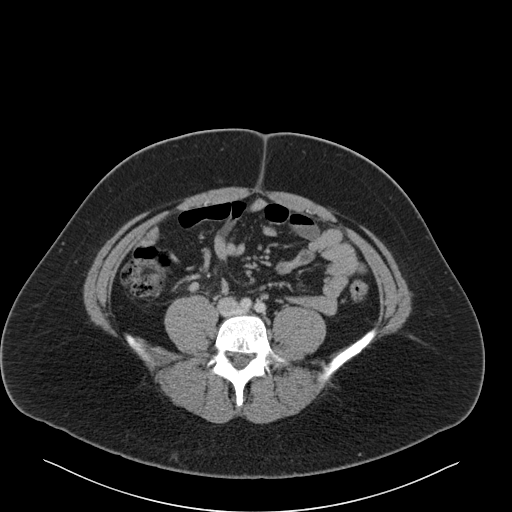
[im 49/97  soft-tissue]
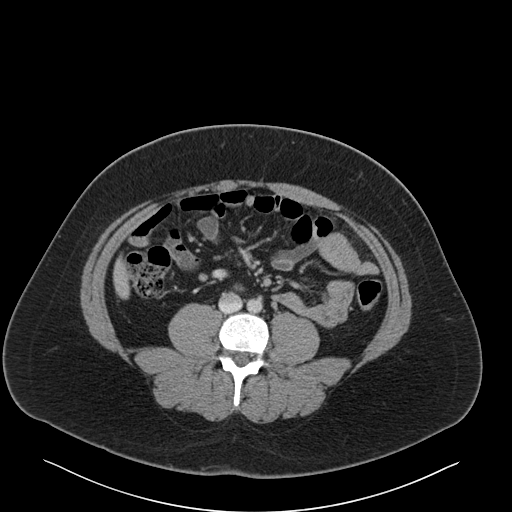
[im 55/97  soft-tissue]
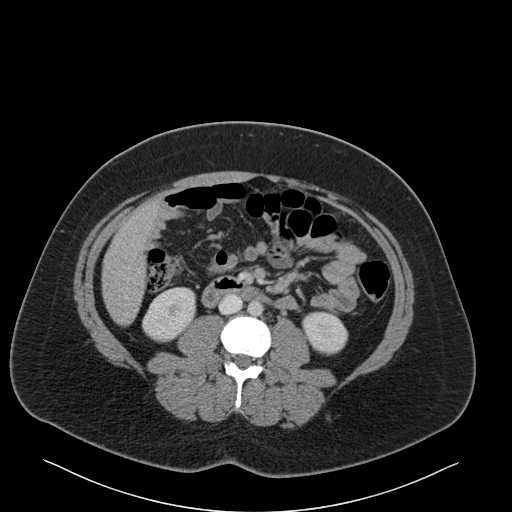
[im 61/97  soft-tissue]
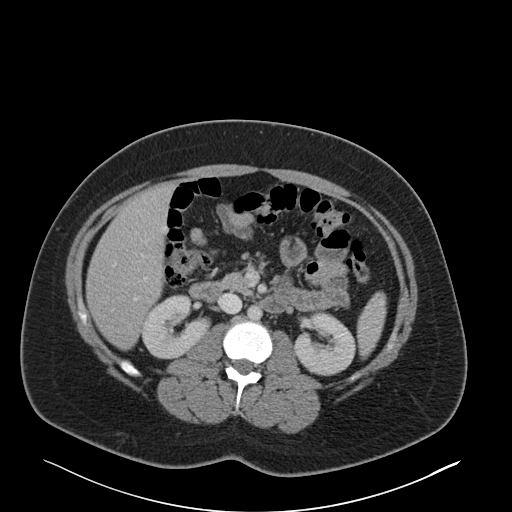
[im 61/97  bone]
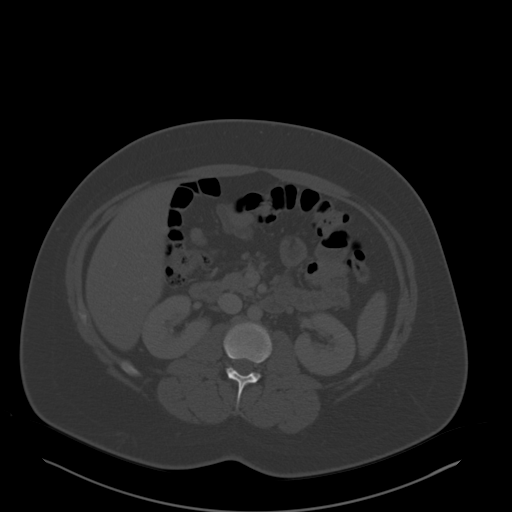
[im 67/97  soft-tissue]
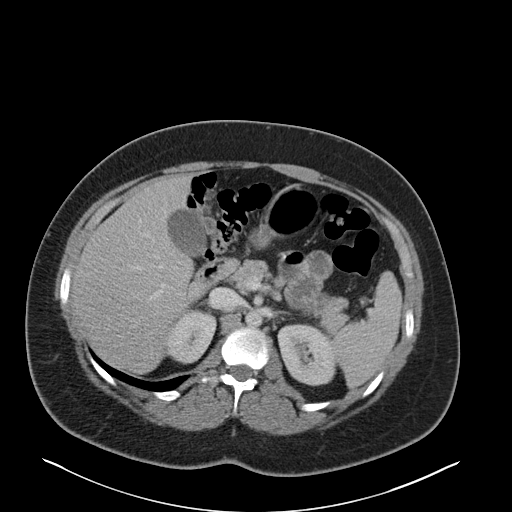
[im 79/97  soft-tissue]
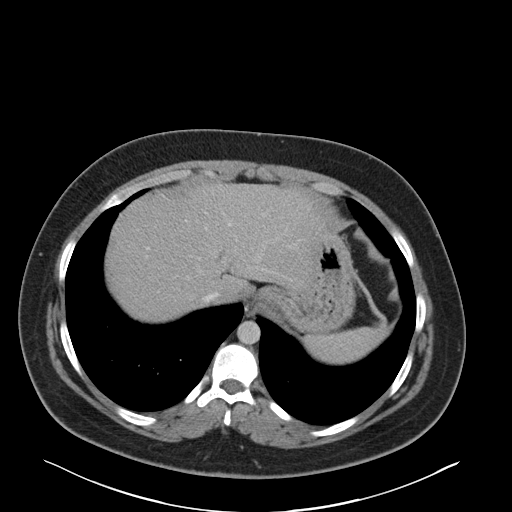
[im 85/97  soft-tissue]
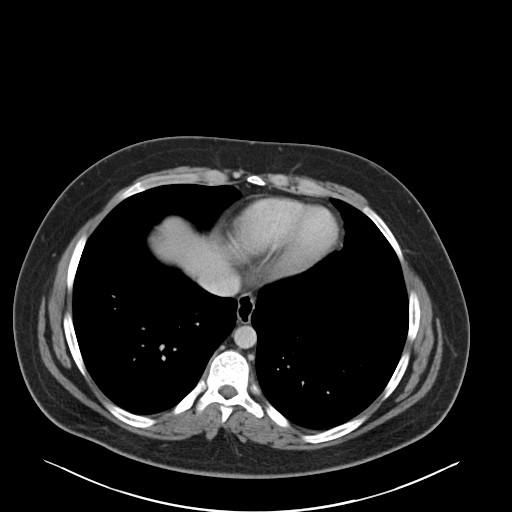
[im 91/97  soft-tissue]
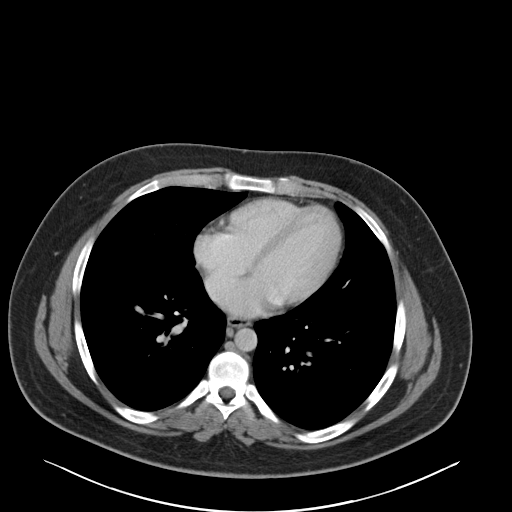

[Series 5: coronal st · coronal · 0.85mm/px · 3 of 106 slices shown]
[im 36/106  soft-tissue]
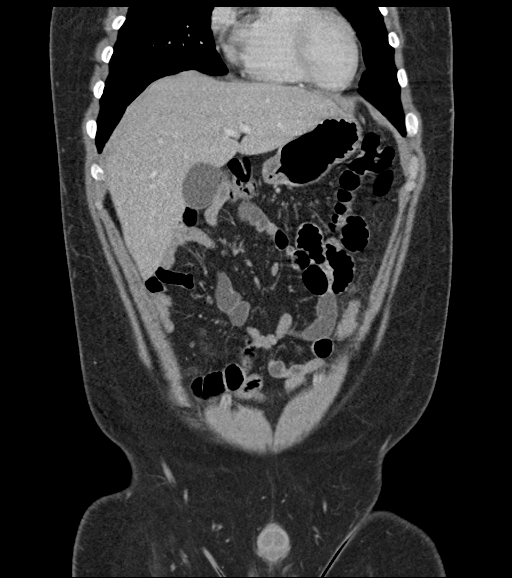
[im 47/106  soft-tissue]
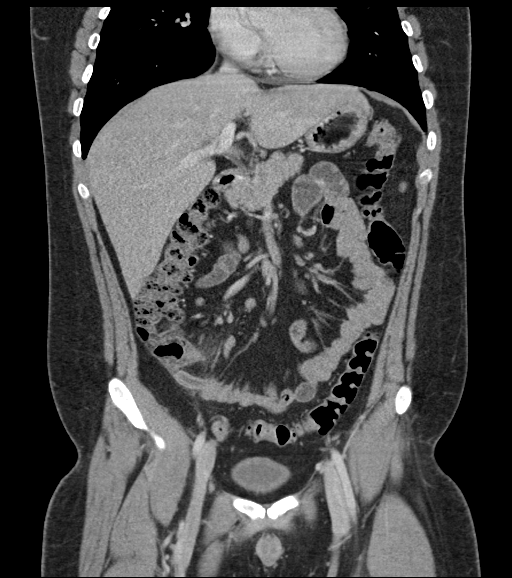
[im 59/106  soft-tissue]
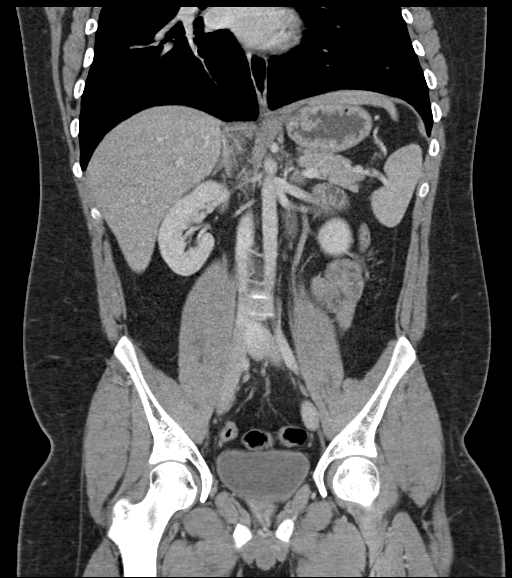

[16 of 46 positions shown; findings below may reference images not displayed]

FINDINGS: Lower chest: Clear lung bases.

Hepatobiliary: No focal liver abnormality is seen. No gallstones,
gallbladder wall thickening, or biliary dilatation.

Pancreas: Unremarkable. No pancreatic ductal dilatation or
surrounding inflammatory changes.

Spleen: Normal in size without focal abnormality.

Adrenals/Urinary Tract: Adrenal glands are unremarkable. Kidneys are
normal, without renal calculi, focal lesion, or hydronephrosis.
Bladder is unremarkable.

Stomach/Bowel: Dilated appendix with diffuse wall thickening and
enhancement. The appendiceal margins are ill-defined with
periappendiceal soft tissue stranding. No fluid collections are
seen. The origin of the appendix is in the upper right pelvis and
the tip is also in the upper right pelvis. The appendix measures 10
mm in maximum diameter.

Unremarkable stomach, small bowel and colon.

Vascular/Lymphatic: Multiple mildly enlarged right lower quadrant
mesenteric lymph nodes. The largest has a short axis diameter of 8
mm on image number 53 series 2. No vascular abnormalities.

Reproductive: Prostate is unremarkable.

Other: No free peritoneal air.

Musculoskeletal: Mild thoracolumbar scoliosis. Mild lower thoracic
spine Schmorl's node formation.
IMPRESSION: 1. Acute appendicitis without abscess.
2. Mild reactive right lower quadrant mesenteric adenopathy.

These results were called by telephone at the time of interpretation
on 01/19/2018 at [DATE] to Dr. Kerziban, who verbally acknowledged these
results.

## 2022-06-09 ENCOUNTER — Ambulatory Visit
Admission: EM | Admit: 2022-06-09 | Discharge: 2022-06-09 | Disposition: A | Discharge status: Home / Self Care | Payer: Self-pay | Attending: Internal Medicine | Admitting: Internal Medicine

## 2022-06-09 ENCOUNTER — Ambulatory Visit (INDEPENDENT_AMBULATORY_CARE_PROVIDER_SITE_OTHER): Payer: Self-pay

## 2022-06-09 DIAGNOSIS — R053 Chronic cough: Secondary | ICD-10-CM

## 2022-06-09 DIAGNOSIS — R062 Wheezing: Secondary | ICD-10-CM

## 2022-06-09 MED ORDER — PREDNISONE 20 MG PO TABS: 40.0000 mg | ORAL_TABLET | Freq: Every day | ORAL | 0 refills | Status: AC

## 2022-06-09 MED ORDER — ALBUTEROL SULFATE HFA 108 (90 BASE) MCG/ACT IN AERS: 1.0000 | INHALATION_SPRAY | Freq: Four times a day (QID) | RESPIRATORY_TRACT | 0 refills | Status: AC | PRN

## 2022-06-09 MED ORDER — BENZONATATE 100 MG PO CAPS: 100.0000 mg | ORAL_CAPSULE | Freq: Three times a day (TID) | ORAL | 0 refills | Status: AC | PRN

## 2022-06-09 MED ORDER — ALBUTEROL SULFATE (2.5 MG/3ML) 0.083% IN NEBU
2.5000 mg | INHALATION_SOLUTION | Freq: Once | RESPIRATORY_TRACT | Status: AC
  Administered 2022-06-09: 2.5 mg via RESPIRATORY_TRACT

## 2022-06-09 NOTE — ED Provider Notes (Addendum)
[facility]    CSN: [id number] Arrival date & time: 06/09/22  1502      History   Chief Complaint Chief Complaint  Patient presents with   Cough    HPI Casey Cross is a 22 y.o. male.   Patient presents with 2 week history of cough and nasal congestion.  Patient reports shortness of breath that started about 4 to 5 days ago and is intermittent.  Reports shortness of breath mainly occurs when lying flat.  He has taken Delsym with no improvement of symptoms.  Denies any known sick contacts or fever.  Denies history of asthma.  Patient does smoke cigarettes and smokes approximately 1 pack every other day.   Cough   History reviewed. No pertinent past medical history.  Patient Active Problem List   Diagnosis Date Noted   Acute appendicitis 01/20/2018    Past Surgical History:  Procedure Laterality Date   LAPAROSCOPIC APPENDECTOMY N/A 01/19/2018   Procedure: APPENDECTOMY LAPAROSCOPIC;  Surgeon: Ovidio Kin, MD;  Location: [facility];  Service: General;  Laterality: N/A;       Home Medications    Prior to Admission medications   Medication Sig Start Date End Date Taking? Authorizing Provider  albuterol (VENTOLIN HFA) 108 (90 Base) MCG/ACT inhaler Inhale 1-2 puffs into the lungs every 6 (six) hours as needed for wheezing or shortness of breath. 06/09/22  Yes Zakiah Beckerman, Rolly Salter E, FNP  benzonatate (TESSALON) 100 MG capsule Take 1 capsule (100 mg total) by mouth every 8 (eight) hours as needed for cough. 06/09/22  Yes Shetara Launer, Rolly Salter E, FNP  predniSONE (DELTASONE) 20 MG tablet Take 2 tablets (40 mg total) by mouth daily for 5 days. 06/09/22 06/14/22 Yes Audriella Blakeley, Acie Fredrickson, FNP  acetaminophen (TYLENOL) 325 MG tablet Take 2 tablets (650 mg total) by mouth every 4 (four) hours as needed. 01/20/18   Barnetta Chapel, PA-C  dicyclomine (BENTYL) 20 MG tablet Take 1 tablet (20 mg total) by mouth 2 (two) times daily for 5 days. 01/18/18 01/23/18  Gerhard Munch, MD    Family  History History reviewed. No pertinent family history.  Social History Social History   Tobacco Use   Smoking status: Every Day    Packs/day: .5    Types: Cigarettes   Smokeless tobacco: Never  Vaping Use   Vaping Use: Never used  Substance Use Topics   Alcohol use: Yes    Comment: weekly   Drug use: Yes    Types: Marijuana     Allergies   Patient has no known allergies.   Review of Systems Review of Systems Per HPI  Physical Exam Triage Vital Signs ED Triage Vitals [06/09/22 1627]  Enc Vitals Group     BP (!) 144/81     Pulse Rate (!) 110     Resp 16     Temp 98.4 F (36.9 C)     Temp Source Oral     SpO2 91 %     Weight      Height  (1.702 m)     Head Circumference      Peak Flow      Pain Score 0     Pain Loc      Pain Edu?      Excl. in GC?    No data found.  Updated Vital Signs BP (!) 144/81 (BP Location: Left Arm)   Pulse (!) 110   Temp 98.4 F (36.9 C) (Oral)   Resp 16  Ht  (1.702 m)   SpO2 98%   BMI 24.57 kg/m   Visual Acuity Right Eye Distance:   Left Eye Distance:   Bilateral Distance:    Right Eye Near:   Left Eye Near:    Bilateral Near:     Physical Exam Constitutional:      General: He is not in acute distress.    Appearance: Normal appearance. He is not toxic-appearing or diaphoretic.  HENT:     Head: Normocephalic and atraumatic.     Right Ear: Tympanic membrane and ear canal normal.     Left Ear: Tympanic membrane and ear canal normal.     Nose: Congestion present.     Comments: Very mild nasal congestion    Mouth/Throat:     Mouth: Mucous membranes are moist.     Pharynx: No posterior oropharyngeal erythema.  Eyes:     Extraocular Movements: Extraocular movements intact.     Conjunctiva/sclera: Conjunctivae normal.     Pupils: Pupils are equal, round, and reactive to light.  Cardiovascular:     Rate and Rhythm: Normal rate and regular rhythm.     Pulses: Normal pulses.     Heart sounds: Normal  heart sounds.  Pulmonary:     Effort: Pulmonary effort is normal. No respiratory distress.     Breath sounds: No stridor. Wheezing present. No rhonchi or rales.     Comments: Mild inspiratory wheezes on auscultation bilaterally. Abdominal:     General: Abdomen is flat. Bowel sounds are normal.     Palpations: Abdomen is soft.  Musculoskeletal:        General: Normal range of motion.     Cervical back: Normal range of motion.  Skin:    General: Skin is warm and dry.  Neurological:     General: No focal deficit present.     Mental Status: He is alert and oriented to person, place, and time. Mental status is at baseline.  Psychiatric:        Mood and Affect: Mood normal.        Behavior: Behavior normal.      UC Treatments / Results  Labs (all labs ordered are listed, but only abnormal results are displayed) Labs Reviewed  CBC  BASIC METABOLIC PANEL    EKG   Radiology DG Chest 2 View  Result Date: 06/09/2022 CLINICAL DATA:  Cough for 2 weeks.  Chest congestion EXAM: CHEST - 2 VIEW COMPARISON:  None Available. FINDINGS: The heart size and mediastinal contours are within normal limits. Both lungs are clear. No consolidation, pneumothorax, effusion or edema. The visualized skeletal structures are unremarkable. Slight curvature of the spine. IMPRESSION: No acute cardiopulmonary disease Electronically Signed   By: Karen Kays M.D.   On: 06/09/2022 17:17    Procedures Procedures (including critical care time)  Medications Ordered in UC Medications  albuterol (PROVENTIL) (2.5 MG/3ML) 0.083% nebulizer solution 2.5 mg (2.5 mg Nebulization Given 06/09/22 1645)    Initial Impression / Assessment and Plan / UC Course  I have reviewed the triage vital signs and the nursing notes.  Pertinent labs & imaging results that were available during my care of the patient were reviewed by me and considered in my medical decision making (see chart for details).     Chest x-ray completed  that was negative for any acute cardiopulmonary process.  Suspicious that patient could have some form of bronchitis versus postviral cough.  It is causing some inflammation in chest and  wheezing so I will treat with albuterol inhaler and prednisone steroid to decrease inflammation.  Benzonatate prescribed to take as needed for cough as well.  Patient was mildly tachycardic on exam but suspect this is due to acute illness versus inflammation.  Suggested EKG but the patient declined with shared decision making which I do think is reasonable given that other vital signs are stable and heart rate sounds normal on auscultation.  Oxygen was 91% on triage but recheck prior to breathing treatment was 98% so do think this may have been a fluke.  Wheezing improved with breathing treatment.  Advised adequate fluid hydration and rest.  Although, I encouraged strict follow-up and ER precautions if symptoms persist or worsen.  Patient verbalized understanding and was agreeable with plan. Final Clinical Impressions(s) / UC Diagnoses   Final diagnoses:  Persistent cough  Wheezing     Discharge Instructions      Suspect you may have bronchitis.  I am treating you with prednisone, albuterol inhaler, cough medication.  Blood work is pending.  Will call if it is abnormal.  Follow-up if any symptoms persist or worsen.     ED Prescriptions     Medication Sig Dispense Auth. Provider   predniSONE (DELTASONE) 20 MG tablet Take 2 tablets (40 mg total) by mouth daily for 5 days. 10 tablet [place], Rolly Salter E, [place]   albuterol (VENTOLIN HFA) 108 (90 Base) MCG/ACT inhaler Inhale 1-2 puffs into the lungs every 6 (six) hours as needed for wheezing or shortness of breath. 1 each [place], [place] E, [place]   benzonatate (TESSALON) 100 MG capsule Take 1 capsule (100 mg total) by mouth every 8 (eight) hours as needed for cough. 21 capsule [place], Acie Fredrickson, [place]      PDMP not reviewed this encounter.   Gustavus Bryant, [place] 06/09/22  1819    Gustavus Bryant, [place] 06/09/22 1820

## 2022-06-09 NOTE — Discharge Instructions (Signed)
Suspect you may have bronchitis.  I am treating you with prednisone, albuterol inhaler, cough medication.  Blood work is pending.  Will call if it is abnormal.  Follow-up if any symptoms persist or worsen.

## 2022-06-09 NOTE — ED Triage Notes (Signed)
Patient here today with c/o cough X 2 weeks, chest congestion, SOB, nasal drainage X 3-4 days, and ST today. He tried taking Delsym with no relief. No sick contacts that he knows of. He went to [place] last month.

## 2022-06-10 LAB — BASIC METABOLIC PANEL
BUN/Creatinine Ratio: 9 (ref 9–20)
BUN: 8 mg/dL (ref 6–20)
CO2: 16 mmol/L — ABNORMAL LOW (ref 20–29)
Calcium: 10.1 mg/dL (ref 8.7–10.2)
Chloride: 100 mmol/L (ref 96–106)
Creatinine, Ser: 0.9 mg/dL (ref 0.76–1.27)
Glucose: 86 mg/dL (ref 70–99)
Potassium: 4.3 mmol/L (ref 3.5–5.2)
Sodium: 139 mmol/L (ref 134–144)
eGFR: 122 mL/min/{1.73_m2} (ref >59)

## 2022-06-10 LAB — CBC
Hematocrit: 49.4 % (ref 37.5–51.0)
Hemoglobin: 16.8 g/dL (ref 13.0–17.7)
MCH: 30.8 pg (ref 26.6–33.0)
MCHC: 34 g/dL (ref 31.5–35.7)
MCV: 91 fL (ref 79–97)
Platelets: 348 10*3/uL (ref 150–450)
RBC: 5.46 x10E6/uL (ref 4.14–5.80)
RDW: 12.9 % (ref 11.6–15.4)
WBC: 12.8 10*3/uL — ABNORMAL HIGH (ref 3.4–10.8)
